# Patient Record
Sex: Female | Born: 1995 | Race: Black or African American | Hispanic: No | Marital: Single | Smoking: Never smoker
Health system: Southern US, Community
[De-identification: ages and names within clinical notes are randomized; demographics above are authoritative.]

## PROBLEM LIST (undated history)

## (undated) HISTORY — PX: ADENOIDECTOMY: SUR15

---
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
## 2017-01-06 ENCOUNTER — Encounter (HOSPITAL_COMMUNITY): Payer: Self-pay | Admitting: Emergency Medicine

## 2017-01-06 ENCOUNTER — Emergency Department (HOSPITAL_COMMUNITY)
Admission: EM | Admit: 2017-01-06 | Discharge: 2017-01-06 | Disposition: A | Discharge status: Home / Self Care | Payer: Medicaid Other | Attending: Emergency Medicine | Admitting: Emergency Medicine

## 2017-01-06 DIAGNOSIS — N3001 Acute cystitis with hematuria: Secondary | ICD-10-CM

## 2017-01-06 DIAGNOSIS — R109 Unspecified abdominal pain: Secondary | ICD-10-CM | POA: Diagnosis present

## 2017-01-06 LAB — COMPREHENSIVE METABOLIC PANEL
ALT: 24 U/L (ref 14–54)
ANION GAP: 7 (ref 5–15)
AST: 21 U/L (ref 15–41)
Albumin: 4.1 g/dL (ref 3.5–5.0)
Alkaline Phosphatase: 41 U/L (ref 38–126)
BUN: 16 mg/dL (ref 6–20)
CHLORIDE: 105 mmol/L (ref 101–111)
CO2: 25 mmol/L (ref 22–32)
Calcium: 8.9 mg/dL (ref 8.9–10.3)
Creatinine, Ser: 0.7 mg/dL (ref 0.44–1.00)
Glucose, Bld: 90 mg/dL (ref 65–99)
POTASSIUM: 3.9 mmol/L (ref 3.5–5.1)
Sodium: 137 mmol/L (ref 135–145)
Total Bilirubin: 1.1 mg/dL (ref 0.3–1.2)
Total Protein: 7.5 g/dL (ref 6.5–8.1)

## 2017-01-06 LAB — WET PREP, GENITAL
Clue Cells Wet Prep HPF POC: NONE SEEN
SPERM: NONE SEEN
Trich, Wet Prep: NONE SEEN
YEAST WET PREP: NONE SEEN

## 2017-01-06 LAB — URINALYSIS, ROUTINE W REFLEX MICROSCOPIC
BILIRUBIN URINE: NEGATIVE
Glucose, UA: NEGATIVE mg/dL
KETONES UR: NEGATIVE mg/dL
NITRITE: NEGATIVE
PROTEIN: 30 mg/dL — AB
Specific Gravity, Urine: 1.021 (ref 1.005–1.030)
pH: 7 (ref 5.0–8.0)

## 2017-01-06 LAB — CBC
HEMATOCRIT: 35 % — AB (ref 36.0–46.0)
HEMOGLOBIN: 11.9 g/dL — AB (ref 12.0–15.0)
MCH: 30 pg (ref 26.0–34.0)
MCHC: 34 g/dL (ref 30.0–36.0)
MCV: 88.2 fL (ref 78.0–100.0)
Platelets: 248 10*3/uL (ref 150–400)
RBC: 3.97 MIL/uL (ref 3.87–5.11)
RDW: 13.1 % (ref 11.5–15.5)
WBC: 5.1 10*3/uL (ref 4.0–10.5)

## 2017-01-06 LAB — I-STAT BETA HCG BLOOD, ED (MC, WL, AP ONLY): I-stat hCG, quantitative: <5 m[IU]/mL (ref <5)

## 2017-01-06 LAB — LIPASE, BLOOD: LIPASE: 16 U/L (ref 11–51)

## 2017-01-06 MED ORDER — CEPHALEXIN 500 MG PO CAPS
500.0000 mg | ORAL_CAPSULE | Freq: Once | ORAL | Status: AC
  Administered 2017-01-06: 500 mg via ORAL
  Filled 2017-01-06: qty 1

## 2017-01-06 MED ORDER — PHENAZOPYRIDINE HCL 100 MG PO TABS
100.0000 mg | ORAL_TABLET | Freq: Once | ORAL | Status: DC
Start: 2017-01-06 — End: 2017-01-06

## 2017-01-06 MED ORDER — CEPHALEXIN 500 MG PO CAPS: 500.0000 mg | ORAL_CAPSULE | Freq: Two times a day (BID) | ORAL | 0 refills | Status: AC

## 2017-01-06 MED ORDER — PHENAZOPYRIDINE HCL 200 MG PO TABS
200.0000 mg | ORAL_TABLET | Freq: Once | ORAL | Status: AC
  Administered 2017-01-06: 200 mg via ORAL
  Filled 2017-01-06: qty 1

## 2017-01-06 MED ORDER — PHENAZOPYRIDINE HCL 200 MG PO TABS: 200.0000 mg | ORAL_TABLET | Freq: Three times a day (TID) | ORAL | 0 refills | Status: AC

## 2017-01-06 NOTE — ED Triage Notes (Signed)
Pt c/o clear vaginal mucus with medium red blood, burning with urination, oliguria, urinary frequency, RLQ abdominal pain, constipation x 3-4 days. No nausea, emesis.

## 2017-01-06 NOTE — ED Provider Notes (Signed)
[facility]-EMERGENCY DEPT Provider Note   CSN: [id number] Arrival date & time: 01/06/17  1209     History   Chief Complaint Chief Complaint  Patient presents with  . Abdominal Pain    HPI Joanne Villegas is a 21 y.o. female.  HPI   Patient is a 21 year old female with no pertinent past medical history presents to the ED with multiple complaints. She notes over the past 3 days she has been having intermittent pain to the right side of her abdomen. Denies radiation. Denies any aggravating or alleviating factors. She also notes she has been constipated and notes her last normal bowel movement was 2 days ago. Denies using any over-the-counter laxatives or stool softeners. Reports having one episode of NBNB vomiting 2 days ago. Patient states she has also been having clear vaginal discharge with intermittent blood present. Endorses associated dysuria, urinary frequency. Denies taking medications at home for her symptoms. Denies fever, chills, chest pain, shortness of breath, nausea, diarrhea, hematuria, flank pain, vaginal bleeding. LMP appx 2 weeks ago. Denies hx of abdominal surgeries.   History reviewed. No pertinent past medical history.  There are no active problems to display for this patient.   Past Surgical History:  Procedure Laterality Date  . ADENOIDECTOMY      OB History    No data available       Home Medications    Prior to Admission medications   Medication Sig Start Date End Date Taking? Authorizing Provider  cephALEXin (KEFLEX) 500 MG capsule Take 1 capsule (500 mg total) by mouth 2 (two) times daily. 01/06/17   Barrett Henle, PA-C  phenazopyridine (PYRIDIUM) 200 MG tablet Take 1 tablet (200 mg total) by mouth 3 (three) times daily. 01/06/17   Barrett Henle, PA-C    Family History No family history on file.  Social History Social History  Substance Use Topics  . Smoking status: Never Smoker  . Smokeless tobacco: Not on file  . Alcohol use  Yes     Comment: occasional     Allergies   Shrimp [shellfish allergy]   Review of Systems Review of Systems  Gastrointestinal: Positive for abdominal pain and constipation.  Genitourinary: Positive for dysuria, frequency and vaginal discharge.  All other systems reviewed and are negative.    Physical Exam Updated Vital Signs BP 132/90 (BP Location: Right Arm)   Pulse 78   Temp 98.4 F (36.9 C) (Oral)   Resp 14   SpO2 100%   Physical Exam  Constitutional: She is oriented to person, place, and time. She appears well-developed and well-nourished. No distress.  HENT:  Head: Normocephalic and atraumatic.  Mouth/Throat: Oropharynx is clear and moist. No oropharyngeal exudate.  Eyes: Conjunctivae and EOM are normal. Right eye exhibits no discharge. Left eye exhibits no discharge. No scleral icterus.  Neck: Normal range of motion. Neck supple.  Cardiovascular: Normal rate, regular rhythm, normal heart sounds and intact distal pulses.   Pulmonary/Chest: Effort normal and breath sounds normal. No respiratory distress. She has no wheezes. She has no rales. She exhibits no tenderness.  Abdominal: Soft. Normal appearance and bowel sounds are normal. She exhibits no distension and no mass. There is no tenderness. There is no rigidity, no rebound, no guarding and no CVA tenderness. No hernia.  Genitourinary: Rectum normal. Rectal exam shows no external hemorrhoid, no fissure and no tenderness.  Genitourinary Comments: Chaperone present.  Musculoskeletal: Normal range of motion. She exhibits no edema.  Neurological: She is alert and oriented  to person, place, and time.  Skin: Skin is warm and dry. She is not diaphoretic.  Nursing note and vitals reviewed.  Pelvic exam: normal external genitalia, vulva, vagina, cervix, uterus and adnexa, VULVA: normal appearing vulva with no masses, tenderness or lesions, VAGINA: normal appearing vagina with normal color and discharge, no lesions, vaginal  discharge - clear, mucoid and scant, CERVIX: normal appearing cervix without discharge or lesions, WET MOUNT done - results: white blood cells, DNA probe for chlamydia and GC obtained, UTERUS: uterus is normal size, shape, consistency and nontender, ADNEXA: normal adnexa in size, nontender and no masses, exam chaperoned by female tech.   ED Treatments / Results  Labs (all labs ordered are listed, but only abnormal results are displayed) Labs Reviewed  WET PREP, GENITAL - Abnormal; Notable for the following:       Result Value   WBC, Wet Prep HPF POC FEW (*)    All other components within normal limits  CBC - Abnormal; Notable for the following:    Hemoglobin 11.9 (*)    HCT 35.0 (*)    All other components within normal limits  URINALYSIS, ROUTINE W REFLEX MICROSCOPIC - Abnormal; Notable for the following:    APPearance CLOUDY (*)    Hgb urine dipstick MODERATE (*)    Protein, ur 30 (*)    Leukocytes, UA LARGE (*)    Bacteria, UA MANY (*)    Squamous Epithelial / LPF 6-30 (*)    All other components within normal limits  LIPASE, BLOOD  COMPREHENSIVE METABOLIC PANEL  RPR  HIV ANTIBODY (ROUTINE TESTING)  I-STAT BETA HCG BLOOD, ED ([facility], [facility], [facility] ONLY)  GC/CHLAMYDIA PROBE AMP () NOT AT [facility]    EKG  EKG Interpretation None       Radiology No results found.  Procedures Procedures (including critical care time)  Medications Ordered in ED Medications  cephALEXin (KEFLEX) capsule 500 mg (500 mg Oral Given 01/06/17 1452)  phenazopyridine (PYRIDIUM) tablet 200 mg (200 mg Oral Given 01/06/17 1452)     Initial Impression / Assessment and Plan / ED Course  I have reviewed the triage vital signs and the nursing notes.  Pertinent labs & imaging results that were available during my care of the patient were reviewed by me and considered in my medical decision making (see chart for details).     Patient presents with dysuria, urinary frequency, vaginal discharge and  right-sided abdominal pain that of the present for the past 3 days. Denies fever, nausea, vomiting. She also reports having mild constipation over the past few days but reports having a normal bowel movement in the ED prior to my initial evaluation. VSS. Exam unremarkable. Pelvic exam revealed small amount of white mucoid discharge in vaginal vault, no CMT or adnexal tenderness. Patient declined pain meds and denies having any abdominal pain at this time. Pregnancy negative. Labs unremarkable. UA consistent with UTI. Suspect patient's symptoms are related to urinary tract infection, plan to start patient on Keflex and discharged home with antibiotics and symptomatic treatment. Workup not concerning for nephrolithiasis, pyelonephritis, appendicitis, PID/STD, bowel obstruction, bowel perforation, cholecystitis, diverticulitis or ectopic pregnancy. Discussed results and plan for discharge with patient along with getting STD testing. Advised patient to follow up with PCP as needed. Discussed return precautions.     Final Clinical Impressions(s) / ED Diagnoses   Final diagnoses:  Acute cystitis with hematuria    New Prescriptions Discharge Medication List as of 01/06/2017  2:44 PM  START taking these medications   Details  cephALEXin (KEFLEX) 500 MG capsule Take 1 capsule (500 mg total) by mouth 2 (two) times daily., Starting Thu 01/06/2017, Print    phenazopyridine (PYRIDIUM) 200 MG tablet Take 1 tablet (200 mg total) by mouth 3 (three) times daily., Starting Thu 01/06/2017, Print         Barrett Henleadeau, Charliee Krenz Elizabeth, PA-C 01/06/17 1459    Rolland PorterJames, Mark, MD 01/12/17 [phone]

## 2017-01-06 NOTE — Discharge Instructions (Signed)
Take your antibiotic as prescribed until completed. Continue drinking fluids at home to remain hydrated. I recommend following up with a primary care provider within the next week if your symptoms have not improved. Your labs results regarding your STD testing is pending and should results within the next 2-3 days. Please inform all sexual partners if you test positive for any of these diseases.  Please return to the Emergency Department if symptoms worsen or new onset of fever, abdominal pain, vomiting, unable to keep fluids down, back/flank pain, vaginal discharge/bleeding.

## 2017-01-07 LAB — GC/CHLAMYDIA PROBE AMP (~~LOC~~) NOT AT ARMC
Chlamydia: NEGATIVE
Neisseria Gonorrhea: NEGATIVE

## 2017-01-09 LAB — HIV ANTIBODY (ROUTINE TESTING W REFLEX): HIV SCREEN 4TH GENERATION: NONREACTIVE

## 2017-01-09 LAB — RPR: RPR Ser Ql: NONREACTIVE

## 2018-06-24 ENCOUNTER — Emergency Department (HOSPITAL_COMMUNITY)
Admission: EM | Admit: 2018-06-24 | Discharge: 2018-06-24 | Disposition: A | Discharge status: Home / Self Care | Payer: Self-pay | Attending: Emergency Medicine | Admitting: Emergency Medicine

## 2018-06-24 DIAGNOSIS — B001 Herpesviral vesicular dermatitis: Secondary | ICD-10-CM | POA: Insufficient documentation

## 2018-06-24 MED ORDER — VALACYCLOVIR HCL 1 G PO TABS: 2000.0000 mg | ORAL_TABLET | Freq: Two times a day (BID) | ORAL | 0 refills | Status: AC

## 2018-06-24 NOTE — Discharge Instructions (Addendum)
Follow up with your doctor or return here as needed.  °

## 2018-06-24 NOTE — ED Provider Notes (Signed)
[facility]-EMERGENCY DEPT Provider Note   CSN: [id number] Arrival date & time: 06/24/18  1352     History   Chief Complaint No chief complaint on file.   HPI Joanne Villegas is a 22 y.o. female who presents to the ED with tiny blister area to the upper lip and wants to know if it is herpes. Patient denies fever, chills or other symptoms.   HPI  No past medical history on file.  There are no active problems to display for this patient.   OB History   No obstetric history on file.      Home Medications    Prior to Admission medications   Medication Sig Start Date End Date Taking? Authorizing Provider  cephALEXin (KEFLEX) 500 MG capsule Take 1 capsule (500 mg total) by mouth 2 (two) times daily. 01/06/17   Barrett HenleNadeau, Nicole Elizabeth, PA-C  phenazopyridine (PYRIDIUM) 200 MG tablet Take 1 tablet (200 mg total) by mouth 3 (three) times daily. 01/06/17   Barrett HenleNadeau, Nicole Elizabeth, PA-C  valACYclovir (VALTREX) 1000 MG tablet Take 2 tablets (2,000 mg total) by mouth 2 (two) times daily. 06/24/18   Janne NapoleonNeese, Joanne M, NP    Family History No family history on file.  Social History Social History   Tobacco Use  . Smoking status: Never Smoker  Substance Use Topics  . Alcohol use: Yes    Comment: occasional  . Drug use: No     Allergies   Shrimp [shellfish allergy]   Review of Systems Review of Systems  Skin: Positive for rash (lip).  All other systems reviewed and are negative.    Physical Exam Updated Vital Signs BP 134/84   Pulse 68   Temp 98.5 F (36.9 C) (Oral)   Resp 18   SpO2 100%   Physical Exam Vitals signs and nursing note reviewed.  Constitutional:      General: She is not in acute distress.    Appearance: She is well-developed.  HENT:     Nose: Nose normal.     Mouth/Throat:     Mouth: Mucous membranes are moist.     Pharynx: Oropharynx is clear.      Comments: Multiple tiny vesicular lesions to the right upper lip c/w  herpes. Eyes:     Extraocular Movements: Extraocular movements intact.     Conjunctiva/sclera: Conjunctivae normal.  Neck:     Musculoskeletal: Neck supple.  Pulmonary:     Effort: Pulmonary effort is normal.  Abdominal:     Palpations: Abdomen is soft.     Tenderness: There is no abdominal tenderness.  Musculoskeletal: Normal range of motion.  Skin:    General: Skin is warm and dry.  Neurological:     Mental Status: She is alert and oriented to person, place, and time.     Cranial Nerves: No cranial nerve deficit.  Psychiatric:        Mood and Affect: Mood normal.      ED Treatments / Results  Labs (all labs ordered are listed, but only abnormal results are displayed) Labs Reviewed - No data to display Radiology No results found.  Procedures Procedures (including critical care time)  Medications Ordered in ED Medications - No data to display   Initial Impression / Assessment and Plan / ED Course  I have reviewed the triage vital signs and the nursing notes. 22 y.o. female here with blister areas to the right upper lip stable for d/c without other symptoms. Will treat with valtrex  and patient to f/u with PCP.   Final Clinical Impressions(s) / ED Diagnoses   Final diagnoses:  Primary herpes simplex infection of lips    ED Discharge Orders         Ordered    valACYclovir (VALTREX) 1000 MG tablet  2 times daily     06/24/18 1432           Kerrie Buffaloeese, Joanne Villegas, [place]NP 06/24/18 1433    Derwood KaplanNanavati, Ankit, MD 06/25/18 [phone]

## 2018-06-24 NOTE — ED Triage Notes (Signed)
Patient here to be check for herpes in her mouth. No other complains at this time.

## 2018-11-27 ENCOUNTER — Emergency Department (HOSPITAL_COMMUNITY)
Admission: EM | Admit: 2018-11-27 | Discharge: 2018-11-28 | Disposition: A | Discharge status: Home / Self Care | Payer: Self-pay | Attending: Emergency Medicine | Admitting: Emergency Medicine

## 2018-11-27 ENCOUNTER — Other Ambulatory Visit: Payer: Self-pay

## 2018-11-27 ENCOUNTER — Encounter (HOSPITAL_COMMUNITY): Payer: Self-pay | Admitting: Emergency Medicine

## 2018-11-27 DIAGNOSIS — Z113 Encounter for screening for infections with a predominantly sexual mode of transmission: Secondary | ICD-10-CM | POA: Insufficient documentation

## 2018-11-27 LAB — URINALYSIS, ROUTINE W REFLEX MICROSCOPIC
Bacteria, UA: NONE SEEN
Bilirubin Urine: NEGATIVE
Glucose, UA: NEGATIVE mg/dL
Hgb urine dipstick: NEGATIVE
Ketones, ur: NEGATIVE mg/dL
Nitrite: NEGATIVE
Protein, ur: NEGATIVE mg/dL
Specific Gravity, Urine: 1.01 (ref 1.005–1.030)
pH: 6 (ref 5.0–8.0)

## 2018-11-27 LAB — PREGNANCY, URINE: Preg Test, Ur: NEGATIVE

## 2018-11-27 NOTE — ED Triage Notes (Signed)
Pt states she went to the bathroom and wiped and "thought she saw a crab". Denies changes in discharge, itching, burning.

## 2018-11-28 MED ORDER — PERMETHRIN 5 % EX CREA: TOPICAL_CREAM | CUTANEOUS | 1 refills | Status: AC

## 2018-11-28 NOTE — ED Notes (Signed)
Patient verbalizes understanding of discharge instructions. Opportunity for questioning and answers were provided. Armband removed by staff, pt discharged from ED.  

## 2018-11-28 NOTE — ED Provider Notes (Signed)
[facility]   CSN: [id number] Arrival date & time: 11/27/18  2049    History   Chief Complaint Chief Complaint  Patient presents with  . SEXUALLY TRANSMITTED DISEASE    HPI Joanne Villegas is a 23 y.o. female.     The history is provided by the patient and medical records.     23 y.o. F here with concern for pubic lice.  States she was eating today and thought she saw a "crab" in her crotch.  States she was not entirely sure because initially thought it might be a flake from her fried chicken, but squished it and some "guts" came out.  States she has not seen anything else.  She did check her bed, couch, etc. And did not see anything else.  Her significant other does not have any symptoms.  She is not having any genital itching, rash, discharge, irregular bleeding, or pelvic pain.  Denies history of STD.  History reviewed. No pertinent past medical history.  There are no active problems to display for this patient.   Past Surgical History:  Procedure Laterality Date  . ADENOIDECTOMY       OB History   No obstetric history on file.      Home Medications    Prior to Admission medications   Medication Sig Start Date End Date Taking? Authorizing Provider  cephALEXin (KEFLEX) 500 MG capsule Take 1 capsule (500 mg total) by mouth 2 (two) times daily. 01/06/17   Barrett HenleNadeau, Nicole Elizabeth, PA-C  phenazopyridine (PYRIDIUM) 200 MG tablet Take 1 tablet (200 mg total) by mouth 3 (three) times daily. 01/06/17   Barrett HenleNadeau, Nicole Elizabeth, PA-C  valACYclovir (VALTREX) 1000 MG tablet Take 2 tablets (2,000 mg total) by mouth 2 (two) times daily. 06/24/18   Janne NapoleonNeese, Hope M, NP    Family History No family history on file.  Social History Social History   Tobacco Use  . Smoking status: Never Smoker  Substance Use Topics  . Alcohol use: Yes    Comment: occasional  . Drug use: No     Allergies   Shrimp [shellfish allergy]    Review of Systems Review of Systems  Genitourinary:       Concern for public lice  All other systems reviewed and are negative.    Physical Exam Updated Vital Signs BP 133/79 (BP Location: Right Arm)   Pulse 65   Temp 98.4 F (36.9 C) (Oral)   Resp 14   SpO2 100%   Physical Exam Vitals signs and nursing note reviewed.  Constitutional:      Appearance: She is well-developed.  HENT:     Head: Normocephalic and atraumatic.  Eyes:     Conjunctiva/sclera: Conjunctivae normal.     Pupils: Pupils are equal, round, and reactive to light.  Neck:     Musculoskeletal: Normal range of motion.  Cardiovascular:     Rate and Rhythm: Normal rate and regular rhythm.     Heart sounds: Normal heart sounds.  Pulmonary:     Effort: Pulmonary effort is normal.     Breath sounds: Normal breath sounds. No stridor.  Abdominal:     General: Bowel sounds are normal. There is no distension.     Palpations: Abdomen is soft. There is no mass.  Genitourinary:    Comments: deferred Musculoskeletal: Normal range of motion.  Skin:    General: Skin is warm and dry.  Neurological:     Mental Status: She  is alert and oriented to person, place, and time.      ED Treatments / Results  Labs (all labs ordered are listed, but only abnormal results are displayed) Labs Reviewed  URINALYSIS, ROUTINE W REFLEX MICROSCOPIC - Abnormal; Notable for the following components:      Result Value   Leukocytes,Ua TRACE (*)    All other components within normal limits  PREGNANCY, URINE    EKG None  Radiology No results found.  Procedures Procedures (including critical care time)  Medications Ordered in ED Medications - No data to display   Initial Impression / Assessment and Plan / ED Course  I have reviewed the triage vital signs and the nursing notes.  Pertinent labs & imaging results that were available during my care of the patient were reviewed by me and considered in my medical decision  making (see chart for details).  23 y.o. F here with concern for public lice.  Denies itching, vaginal discharge, irregular bleeding, abdominal pain, or pelvic pain.  No hx of STD.  Will treat with course of permethrin.  Recommended washing sheets, linens, towels, etc in hot water afterwards.  Can follow-up with PCP.  Return here for any new or acute changes.  Final Clinical Impressions(s) / ED Diagnoses   Final diagnoses:  Encounter for screening examination for sexually transmitted disease    ED Discharge Orders         Ordered    permethrin (ELIMITE) 5 % cream     11/28/18 0010           Garlon Hatchet, PA-C 11/28/18 0031    Nira Conn, MD 11/29/18 Rickey Primus

## 2018-11-28 NOTE — Discharge Instructions (Signed)
Take the prescribed medication as directed. I would wash your sheets, linens, towels in hot water after use. Follow-up with your primary care doctor. Return to the ED for new or worsening symptoms.

## 2018-11-28 NOTE — ED Notes (Signed)
Patient is A&Ox4 at this time.  Patient in no signs of distress.  Please see providers note for complete history and physical exam.  

## 2019-05-15 ENCOUNTER — Other Ambulatory Visit: Payer: Self-pay

## 2019-05-15 DIAGNOSIS — Z20822 Contact with and (suspected) exposure to covid-19: Secondary | ICD-10-CM

## 2019-05-17 LAB — NOVEL CORONAVIRUS, NAA: SARS-CoV-2, NAA: NOT DETECTED

## 2019-06-15 ENCOUNTER — Other Ambulatory Visit: Payer: Self-pay

## 2019-06-15 DIAGNOSIS — Z20822 Contact with and (suspected) exposure to covid-19: Secondary | ICD-10-CM

## 2019-06-17 LAB — NOVEL CORONAVIRUS, NAA: SARS-CoV-2, NAA: NOT DETECTED

## 2019-10-10 ENCOUNTER — Emergency Department (HOSPITAL_COMMUNITY)
Admission: EM | Admit: 2019-10-10 | Discharge: 2019-10-11 | Disposition: A | Discharge status: Home / Self Care | Payer: Medicaid Other | Attending: Emergency Medicine | Admitting: Emergency Medicine

## 2019-10-10 ENCOUNTER — Other Ambulatory Visit: Payer: Self-pay

## 2019-10-10 DIAGNOSIS — Z5321 Procedure and treatment not carried out due to patient leaving prior to being seen by health care provider: Secondary | ICD-10-CM | POA: Insufficient documentation

## 2019-10-10 DIAGNOSIS — R112 Nausea with vomiting, unspecified: Secondary | ICD-10-CM | POA: Insufficient documentation

## 2019-10-10 DIAGNOSIS — R197 Diarrhea, unspecified: Secondary | ICD-10-CM | POA: Insufficient documentation

## 2019-10-10 LAB — CBC
HCT: 44.4 % (ref 36.0–46.0)
Hemoglobin: 14.5 g/dL (ref 12.0–15.0)
MCH: 30.1 pg (ref 26.0–34.0)
MCHC: 32.7 g/dL (ref 30.0–36.0)
MCV: 92.1 fL (ref 80.0–100.0)
Platelets: 290 10*3/uL (ref 150–400)
RBC: 4.82 MIL/uL (ref 3.87–5.11)
RDW: 12.5 % (ref 11.5–15.5)
WBC: 8 10*3/uL (ref 4.0–10.5)
nRBC: 0 % (ref 0.0–0.2)

## 2019-10-10 LAB — COMPREHENSIVE METABOLIC PANEL
ALT: 30 U/L (ref 0–44)
AST: 31 U/L (ref 15–41)
Albumin: 3.8 g/dL (ref 3.5–5.0)
Alkaline Phosphatase: 41 U/L (ref 38–126)
Anion gap: 10 (ref 5–15)
BUN: 11 mg/dL (ref 6–20)
CO2: 20 mmol/L — ABNORMAL LOW (ref 22–32)
Calcium: 8.6 mg/dL — ABNORMAL LOW (ref 8.9–10.3)
Chloride: 107 mmol/L (ref 98–111)
Creatinine, Ser: 0.66 mg/dL (ref 0.44–1.00)
GFR calc Af Amer: >60 mL/min (ref >60)
GFR calc non Af Amer: >60 mL/min (ref >60)
Glucose, Bld: 103 mg/dL — ABNORMAL HIGH (ref 70–99)
Potassium: 4 mmol/L (ref 3.5–5.1)
Sodium: 137 mmol/L (ref 135–145)
Total Bilirubin: 1.9 mg/dL — ABNORMAL HIGH (ref 0.3–1.2)
Total Protein: 7.2 g/dL (ref 6.5–8.1)

## 2019-10-10 LAB — LIPASE, BLOOD: Lipase: 20 U/L (ref 11–51)

## 2019-10-10 MED ORDER — ONDANSETRON 4 MG PO TBDP
4.0000 mg | ORAL_TABLET | Freq: Once | ORAL | Status: AC | PRN
  Administered 2019-10-10: 4 mg via ORAL
  Filled 2019-10-10: qty 1

## 2019-10-10 MED ORDER — SODIUM CHLORIDE 0.9% FLUSH: 3.0000 mL | Freq: Once | INTRAVENOUS | Status: DC

## 2019-10-10 NOTE — ED Notes (Signed)
Pt not responding for vitals x3

## 2019-10-10 NOTE — ED Triage Notes (Signed)
C/o NVD started this AM; denies PMH; stated took a home pregnancy test and it was negative.
